# Patient Record
Sex: Female | Born: 1975 | Race: White | Hispanic: No | Marital: Married | Smoking: Never smoker
Health system: Southern US, Community
[De-identification: ages and names within clinical notes are randomized; demographics above are authoritative.]

## PROBLEM LIST (undated history)

## (undated) DIAGNOSIS — F32A Depression, unspecified: Secondary | ICD-10-CM

## (undated) DIAGNOSIS — I341 Nonrheumatic mitral (valve) prolapse: Secondary | ICD-10-CM

## (undated) DIAGNOSIS — F329 Major depressive disorder, single episode, unspecified: Secondary | ICD-10-CM

## (undated) HISTORY — PX: TONSILLECTOMY: SUR1361

---

## 2000-08-04 ENCOUNTER — Other Ambulatory Visit: Admission: RE | Admit: 2000-08-04 | Discharge: 2000-08-04 | Payer: Self-pay | Admitting: Gynecology

## 2001-02-11 ENCOUNTER — Inpatient Hospital Stay (HOSPITAL_COMMUNITY): Admission: AD | Admit: 2001-02-11 | Discharge: 2001-02-11 | Payer: Self-pay | Admitting: *Deleted

## 2001-02-22 ENCOUNTER — Inpatient Hospital Stay (HOSPITAL_COMMUNITY): Admission: AD | Admit: 2001-02-22 | Discharge: 2001-02-24 | Payer: Self-pay | Admitting: Gynecology

## 2001-02-25 ENCOUNTER — Encounter: Admission: RE | Admit: 2001-02-25 | Discharge: 2001-03-27 | Payer: Self-pay | Admitting: Gynecology

## 2001-04-08 ENCOUNTER — Other Ambulatory Visit: Admission: RE | Admit: 2001-04-08 | Discharge: 2001-04-08 | Payer: Self-pay | Admitting: *Deleted

## 2001-04-27 ENCOUNTER — Encounter: Admission: RE | Admit: 2001-04-27 | Discharge: 2001-05-27 | Payer: Self-pay | Admitting: Gynecology

## 2001-05-28 ENCOUNTER — Encounter: Admission: RE | Admit: 2001-05-28 | Discharge: 2001-06-27 | Payer: Self-pay | Admitting: Gynecology

## 2002-04-10 ENCOUNTER — Other Ambulatory Visit: Admission: RE | Admit: 2002-04-10 | Discharge: 2002-04-10 | Payer: Self-pay | Admitting: Gynecology

## 2003-04-16 ENCOUNTER — Other Ambulatory Visit: Admission: RE | Admit: 2003-04-16 | Discharge: 2003-04-16 | Payer: Self-pay | Admitting: Gynecology

## 2004-05-19 ENCOUNTER — Other Ambulatory Visit: Admission: RE | Admit: 2004-05-19 | Discharge: 2004-05-19 | Payer: Self-pay | Admitting: Gynecology

## 2005-07-28 ENCOUNTER — Other Ambulatory Visit: Admission: RE | Admit: 2005-07-28 | Discharge: 2005-07-28 | Payer: Self-pay | Admitting: Gynecology

## 2006-07-30 ENCOUNTER — Other Ambulatory Visit: Admission: RE | Admit: 2006-07-30 | Discharge: 2006-07-30 | Payer: Self-pay | Admitting: Gynecology

## 2007-07-25 ENCOUNTER — Inpatient Hospital Stay (HOSPITAL_COMMUNITY): Admission: AD | Admit: 2007-07-25 | Discharge: 2007-07-27 | Payer: Self-pay | Admitting: Obstetrics and Gynecology

## 2007-07-28 ENCOUNTER — Encounter: Admission: RE | Admit: 2007-07-28 | Discharge: 2007-08-27 | Payer: Self-pay | Admitting: Obstetrics and Gynecology

## 2007-08-28 ENCOUNTER — Encounter: Admission: RE | Admit: 2007-08-28 | Discharge: 2007-09-26 | Payer: Self-pay | Admitting: Obstetrics and Gynecology

## 2007-09-27 ENCOUNTER — Encounter: Admission: RE | Admit: 2007-09-27 | Discharge: 2007-10-27 | Payer: Self-pay | Admitting: Obstetrics and Gynecology

## 2007-10-28 ENCOUNTER — Encounter: Admission: RE | Admit: 2007-10-28 | Discharge: 2007-11-26 | Payer: Self-pay | Admitting: Obstetrics and Gynecology

## 2007-11-27 ENCOUNTER — Encounter: Admission: RE | Admit: 2007-11-27 | Discharge: 2007-12-27 | Payer: Self-pay | Admitting: Obstetrics and Gynecology

## 2007-12-28 ENCOUNTER — Encounter: Admission: RE | Admit: 2007-12-28 | Discharge: 2008-01-27 | Payer: Self-pay | Admitting: Obstetrics and Gynecology

## 2008-01-28 ENCOUNTER — Encounter: Admission: RE | Admit: 2008-01-28 | Discharge: 2008-02-24 | Payer: Self-pay | Admitting: Obstetrics and Gynecology

## 2008-02-25 ENCOUNTER — Encounter: Admission: RE | Admit: 2008-02-25 | Discharge: 2008-03-26 | Payer: Self-pay | Admitting: Obstetrics and Gynecology

## 2008-03-27 ENCOUNTER — Encounter: Admission: RE | Admit: 2008-03-27 | Discharge: 2008-04-25 | Payer: Self-pay | Admitting: Obstetrics and Gynecology

## 2008-04-26 ENCOUNTER — Encounter: Admission: RE | Admit: 2008-04-26 | Discharge: 2008-05-17 | Payer: Self-pay | Admitting: Obstetrics and Gynecology

## 2011-04-28 NOTE — Discharge Summary (Signed)
Kelly Bean, Kelly Bean                 ACCOUNT NO.:  [id number]   MEDICAL RECORD NO.:  [id number]          PATIENT TYPE:  INP   LOCATION:  9148                          FACILITY:  [facility]   PHYSICIAN:  Gerrit Friends. Aldona Bar, M.D.   DATE OF BIRTH:  1976/05/22   DATE OF ADMISSION:  07/25/2007  DATE OF DISCHARGE:  07/27/2007                               DISCHARGE SUMMARY   DISCHARGE DIAGNOSES:  1. 37-week intrauterine pregnancy, delivered 6 pounds 2 ounces female      infant, Apgars 05/19/08.  2. Blood type O positive.   PROCEDURES:  Normal spontaneous delivery.   SUMMARY:  This 35 year old gravida 3, para 2 was admitted at 37+ weeks  gestation after spontaneous rupture of membranes - clear fluid and onset  of contractions.  Her pregnancy was relatively uncomplicated - she does  take Prozac on a regular basis for depression.  She was admitted,  followed, progressed and subsequently had a normal spontaneous delivery  of viable female infant weighing 6 pounds 2 ounces over intact perineum.  Apgars were 05/19/08.  The patient's postpartum course was benign.  Discharge hemoglobin (August 12) was 11.0, white count of 12,000,  platelet count of 195,000.  The morning of August 13, she was ambulating  well, tolerating a regular diet well.  Her breast-feeding was going  well.  Vital signs were stable and she was desirous of discharge to  home.  Baby likewise was ready for discharge.  Appropriate instructions  were given to the patient per discharge brochure and understood by the  patient.   DISCHARGE MEDICATIONS:  1. vitamins - one a day as long she is breast-feeding.  2. Motrin 600 mg every 6 hours as needed for cramping or pain.  3. Tylox 1-2 every 4-6 hours as needed for more severe pain.   She will return the office follow-up in approximately four weeks' time  or as needed.   CONDITION ON DISCHARGE:  Improved.      Gerrit Friends. Aldona Bar, M.D.  Electronically Signed     RMW/MEDQ  D:  07/27/2007  T:   07/27/2007  Job:  [id number]

## 2011-04-28 NOTE — Discharge Summary (Signed)
NAMECRISTI, GWYNN                 ACCOUNT NO.:  [id number]   MEDICAL RECORD NO.:  [id number]          PATIENT TYPE:  INP   LOCATION:  9148                          FACILITY:  [facility]   PHYSICIAN:  Gerrit Friends. Aldona Bar, M.D.   DATE OF BIRTH:  [birth year]   DATE OF ADMISSION:  07/25/2007  DATE OF DISCHARGE:  07/27/2007                               DISCHARGE SUMMARY   DISCHARGE DIAGNOSES:  1. A 37-week intrauterine pregnancy, delivered a 6 pound 2 ounce female      infant, Apgars 6, 6, and 9.  2.   Dictation ended at this point.      Gerrit Friends. Aldona Bar, M.D.     RMW/MEDQ  D:  07/27/2007  T:  07/28/2007  Job:  [id number]

## 2011-09-28 LAB — CBC
HCT: 31.4 — ABNORMAL LOW
HCT: 37.5
Hemoglobin: 11 — ABNORMAL LOW
Hemoglobin: 12.8
MCHC: 34.1
MCHC: 35
MCV: 86.6
MCV: 86.8
Platelets: 195
Platelets: 235
RBC: 3.63 — ABNORMAL LOW
RBC: 4.32
RDW: 12.8
RDW: 13.4
WBC: 12 — ABNORMAL HIGH
WBC: 13.1 — ABNORMAL HIGH

## 2011-09-28 LAB — RPR: RPR Ser Ql: NONREACTIVE

## 2012-02-16 ENCOUNTER — Other Ambulatory Visit (HOSPITAL_BASED_OUTPATIENT_CLINIC_OR_DEPARTMENT_OTHER): Payer: Self-pay | Admitting: General Surgery

## 2012-02-16 ENCOUNTER — Ambulatory Visit (HOSPITAL_COMMUNITY)
Admission: RE | Admit: 2012-02-16 | Discharge: 2012-02-16 | Disposition: A | Discharge status: Home / Self Care | Payer: 59 | Source: Ambulatory Visit | Attending: General Surgery | Admitting: General Surgery

## 2012-02-16 DIAGNOSIS — T148XXA Other injury of unspecified body region, initial encounter: Secondary | ICD-10-CM

## 2012-02-16 DIAGNOSIS — M79609 Pain in unspecified limb: Secondary | ICD-10-CM | POA: Insufficient documentation

## 2012-02-16 DIAGNOSIS — M7730 Calcaneal spur, unspecified foot: Secondary | ICD-10-CM | POA: Insufficient documentation

## 2012-03-25 ENCOUNTER — Emergency Department (INDEPENDENT_AMBULATORY_CARE_PROVIDER_SITE_OTHER): Payer: 59

## 2012-03-25 ENCOUNTER — Encounter (HOSPITAL_BASED_OUTPATIENT_CLINIC_OR_DEPARTMENT_OTHER): Payer: Self-pay | Admitting: *Deleted

## 2012-03-25 ENCOUNTER — Emergency Department (HOSPITAL_BASED_OUTPATIENT_CLINIC_OR_DEPARTMENT_OTHER)
Admission: EM | Admit: 2012-03-25 | Discharge: 2012-03-25 | Disposition: A | Discharge status: Home / Self Care | Payer: 59 | Attending: Emergency Medicine | Admitting: Emergency Medicine

## 2012-03-25 DIAGNOSIS — R079 Chest pain, unspecified: Secondary | ICD-10-CM | POA: Insufficient documentation

## 2012-03-25 DIAGNOSIS — S20219A Contusion of unspecified front wall of thorax, initial encounter: Secondary | ICD-10-CM | POA: Insufficient documentation

## 2012-03-25 DIAGNOSIS — F329 Major depressive disorder, single episode, unspecified: Secondary | ICD-10-CM | POA: Insufficient documentation

## 2012-03-25 DIAGNOSIS — W010XXA Fall on same level from slipping, tripping and stumbling without subsequent striking against object, initial encounter: Secondary | ICD-10-CM | POA: Insufficient documentation

## 2012-03-25 DIAGNOSIS — Z79899 Other long term (current) drug therapy: Secondary | ICD-10-CM | POA: Insufficient documentation

## 2012-03-25 DIAGNOSIS — W19XXXA Unspecified fall, initial encounter: Secondary | ICD-10-CM

## 2012-03-25 DIAGNOSIS — R0602 Shortness of breath: Secondary | ICD-10-CM | POA: Insufficient documentation

## 2012-03-25 DIAGNOSIS — Y9366 Activity, soccer: Secondary | ICD-10-CM | POA: Insufficient documentation

## 2012-03-25 DIAGNOSIS — F3289 Other specified depressive episodes: Secondary | ICD-10-CM | POA: Insufficient documentation

## 2012-03-25 HISTORY — DX: Nonrheumatic mitral (valve) prolapse: I34.1

## 2012-03-25 HISTORY — DX: Depression, unspecified: F32.A

## 2012-03-25 HISTORY — DX: Major depressive disorder, single episode, unspecified: F32.9

## 2012-03-25 MED ORDER — IBUPROFEN 600 MG PO TABS: 600.0000 mg | ORAL_TABLET | Freq: Four times a day (QID) | ORAL | Status: AC | PRN

## 2012-03-25 MED ORDER — MORPHINE SULFATE 4 MG/ML IJ SOLN
4.0000 mg | Freq: Once | INTRAMUSCULAR | Status: AC
  Administered 2012-03-25: 4 mg via INTRAMUSCULAR
  Filled 2012-03-25: qty 1

## 2012-03-25 MED ORDER — HYDROCODONE-ACETAMINOPHEN 5-325 MG PO TABS: 1.0000 | ORAL_TABLET | Freq: Four times a day (QID) | ORAL | Status: AC | PRN

## 2012-03-25 NOTE — ED Notes (Signed)
Pt instructed on correct usage of IS, pt able to return demonstration correctly

## 2012-03-25 NOTE — ED Notes (Signed)
Tripped and fell tonight. Her left elbow hit her ribs. Sharp pain and sob as the night has progressed.

## 2012-03-25 NOTE — ED Notes (Signed)
Pain under left breast, stated that she felt a pop earlier today, pain radiates to back. Pain has gotten progressively worse

## 2012-03-25 NOTE — Discharge Instructions (Signed)
Rib Contusion  A rib contusion (bruise) can occur by a blow to the chest or by a fall against a hard object. Usually these will be much better in a couple weeks. If X-rays were taken today and there are no broken bones (fractures), the diagnosis of bruising is made. However, broken ribs may not show up for several days, or may be discovered later on a routine X-ray when signs of healing show up. If this happens to you, it does not mean that something was missed on the X-ray, but simply that it did not show up on the first X-rays. Earlier diagnosis will not usually change the treatment.  HOME CARE INSTRUCTIONS    Avoid strenuous activity. Be careful during activities and avoid bumping the injured ribs. Activities that pull on the injured ribs and cause pain should be avoided, if possible.   For the first day or two, an ice pack used every 20 minutes while awake may be helpful. Put ice in a plastic bag and put a towel between the bag and the skin.   Eat a normal, well-balanced diet. Drink plenty of fluids to avoid constipation.   Take deep breaths several times a day to keep lungs free of infection. Try to cough several times a day. Splint the injured area with a pillow while coughing to ease pain. Coughing can help prevent pneumonia.   Wear a rib belt or binder only if told to do so by your caregiver. If you are wearing a rib belt or binder, you must do the breathing exercises as directed by your caregiver. If not used properly, rib belts or binders restrict breathing which can lead to pneumonia.   Only take over-the-counter or prescription medicines for pain, discomfort, or fever as directed by your caregiver.  SEEK MEDICAL CARE IF:    You or your child has an oral temperature above 102 F (38.9 C).   Your baby is older than 3 months with a rectal temperature of 100.5 F (38.1 C) or higher for more than 1 day.   You develop a cough, with thick or bloody sputum.  SEEK IMMEDIATE MEDICAL CARE IF:    You  have difficulty breathing.   You feel sick to your stomach (nausea), have vomiting or belly (abdominal) pain.   You have worsening pain, not controlled with medications, or there is a change in the location of the pain.   You develop sweating or radiation of the pain into the arms, jaw or shoulders, or become light headed or faint.   You or your child has an oral temperature above 102 F (38.9 C), not controlled by medicine.   Your or your baby is older than 3 months with a rectal temperature of 102 F (38.9 C) or higher.   Your baby is 3 months old or younger with a rectal temperature of 100.4 F (38 C) or higher.  MAKE SURE YOU:    Understand these instructions.   Will watch your condition.   Will get help right away if you are not doing well or get worse.  Document Released: 08/25/2001 Document Revised: 11/19/2011 Document Reviewed: 07/18/2008  [employer] Patient Information 2012 [employer]

## 2012-03-25 NOTE — ED Provider Notes (Signed)
History     CSN: [id number]  Arrival date & time 03/25/12  2131   First MD Initiated Contact with Patient 03/25/12 2235      Chief Complaint  Patient presents with  . Fall    (Consider location/radiation/quality/duration/timing/severity/associated sxs/prior treatment) HPI  C/o pain left ribs. Larey Seat while playing soccer with daughter. Currently 10/10. No pain medication prior to arrival. Feels short of breath from pain intensity. Denies other injury. Denies N/V.  Denies arm pain. Denies neck pain, back pain.  Denies numbness/tingling/weakness of extr. No hip pain. Ambulatory without difficulty.  ED Notes, ED Provider Notes from 03/25/12 0000 to 03/25/12 21:54:12       Julieanne Manson, RN 03/25/2012 21:52      Tripped and fell tonight. Her left elbow hit her ribs. Sharp pain and sob as the night has progressed.      Past Medical History  Diagnosis Date  . Depression   . Mitral valve prolapse     Past Surgical History  Procedure Date  . Tonsillectomy     No family history on file.  History  Substance Use Topics  . Smoking status: Never Smoker   . Smokeless tobacco: Not on file  . Alcohol Use: No    OB History    Grav Para Term Preterm Abortions TAB SAB Ect Mult Living                  Review of Systems  All other systems reviewed and are negative.   except as noted HPI   Allergies  Review of patient's allergies indicates no known allergies.  Home Medications   Current Outpatient Rx  Name Route Sig Dispense Refill  . ACETAMINOPHEN 500 MG PO TABS Oral Take 1,000 mg by mouth every 6 (six) hours as needed. Patient used this medication for her headache.    . DESVENLAFAXINE SUCCINATE ER 50 MG PO TB24 Oral Take 50 mg by mouth daily. Patient took 25 mg of this tablet.  Patient stopped taking this medication three days ago.    Marland Kitchen DROSPIRENONE-ETHINYL ESTRADIOL 3-0.02 MG PO TABS Oral Take 1 tablet by mouth daily. Patient stopped taking this medication.    .  FLUOXETINE HCL 20 MG PO TABS Oral Take 20 mg by mouth daily.    Marland Kitchen HYDROCODONE-ACETAMINOPHEN 5-325 MG PO TABS Oral Take 1 tablet by mouth every 6 (six) hours as needed for pain. 6 tablet 0  . IBUPROFEN 600 MG PO TABS Oral Take 1 tablet (600 mg total) by mouth every 6 (six) hours as needed for pain. 30 tablet 0    BP 142/77  Pulse 80  Temp(Src) 98.6 F (37 C) (Oral)  Resp 20  SpO2 100%  LMP 03/04/2012  Physical Exam  Nursing note and vitals reviewed. Constitutional: She is oriented to person, place, and time. She appears well-developed.  HENT:  Head: Atraumatic.  Mouth/Throat: Oropharynx is clear and moist.  Eyes: Conjunctivae and EOM are normal. Pupils are equal, round, and reactive to light.  Neck: Normal range of motion. Neck supple.  Cardiovascular: Normal rate, regular rhythm, normal heart sounds and intact distal pulses.   Pulmonary/Chest: Effort normal and breath sounds normal. No respiratory distress. She has no wheezes. She has no rales. She exhibits tenderness.       Lt lower rib diffuse ttp  Abdominal: Soft. She exhibits no distension. There is no tenderness. There is no rebound and no guarding.  Musculoskeletal: Normal range of motion.  Neurological: She is alert  and oriented to person, place, and time.  Skin: Skin is warm and dry. No rash noted.  Psychiatric: She has a normal mood and affect.    ED Course  Procedures (including critical care time)  Labs Reviewed - No data to display Dg Ribs Unilateral W/chest Left  03/25/2012  *RADIOLOGY REPORT*  Clinical Data: Fall, left chest pain  LEFT RIBS AND CHEST - 3+ VIEW  Comparison: None.  Findings: Lungs are clear. No pleural effusion or pneumothorax.  Cardiomediastinal silhouette is within normal limits.  No left rib fracture is seen.  IMPRESSION: No evidence of acute cardiopulmonary disease.  No left rib fracture is seen.  Original Report Authenticated By: Charline Bills, M.D.     1. Rib contusion   2. Fall      MDM  Rib contusion. Pain control, IS, home with PMD f/u as needed. Precautions for return.        Forbes Cellar, MD 03/25/12 2337

## 2014-01-22 IMAGING — CR DG FOOT COMPLETE 3+V*R*
3 series · 3 of 3 positions shown · non-contrast
Comparison: None.

CLINICAL DATA: Pain

RIGHT FOOT COMPLETE - 3+ VIEW

[t foot ap right]
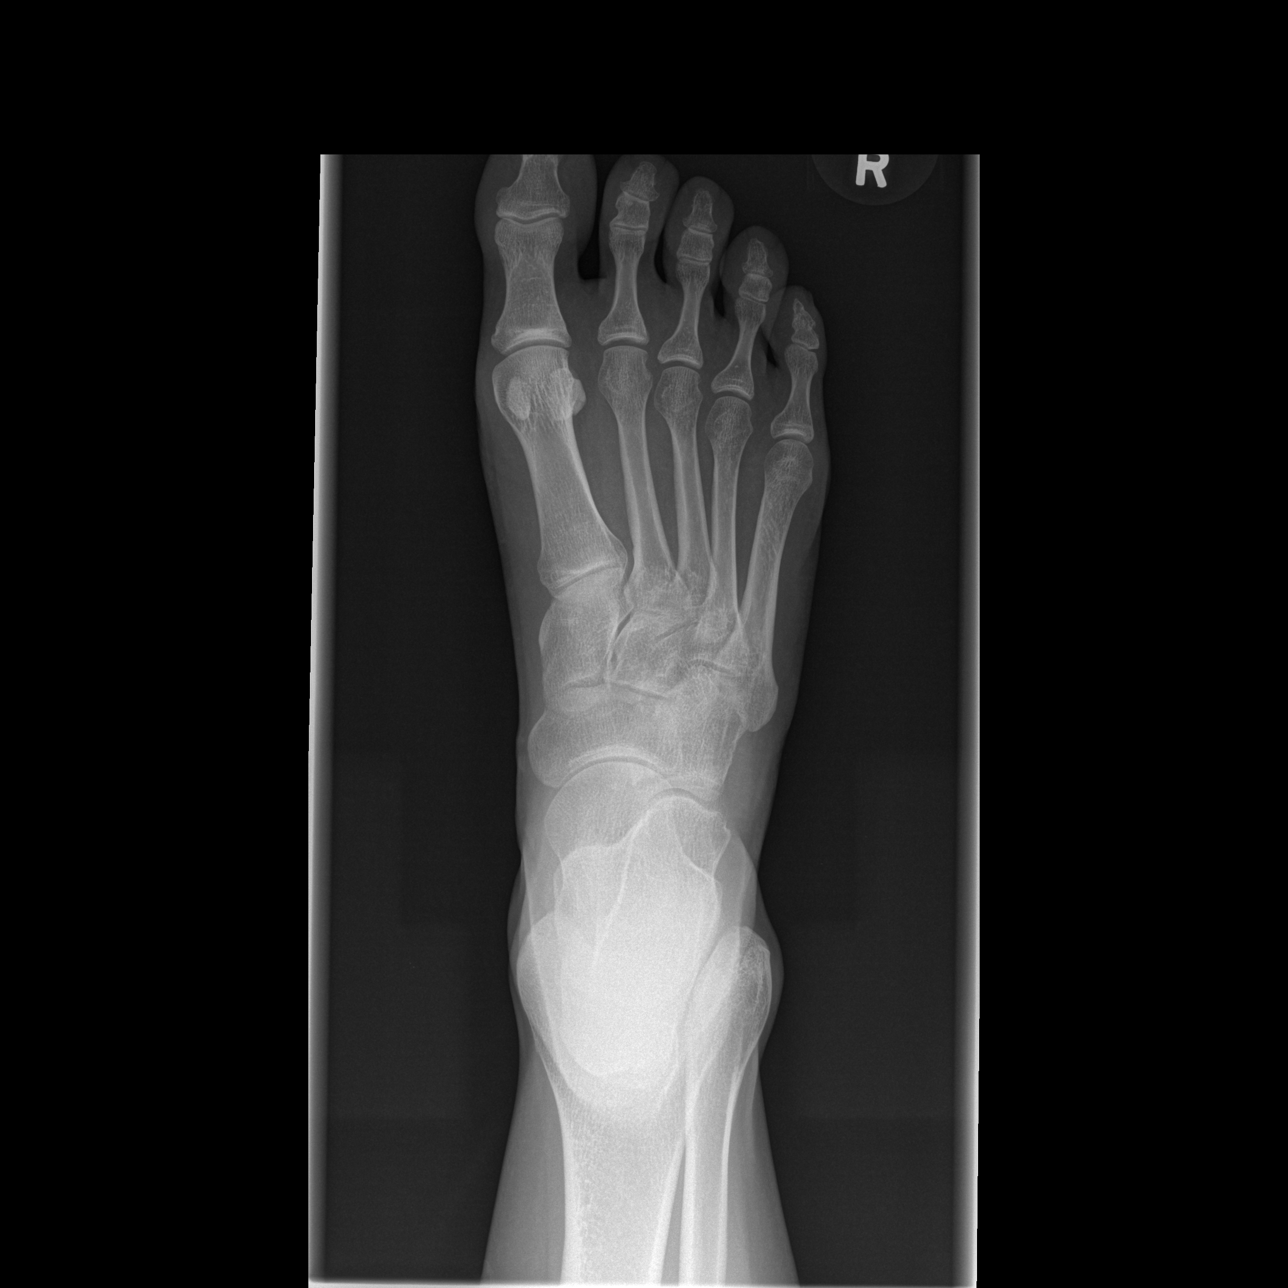

[t foot oblique right]
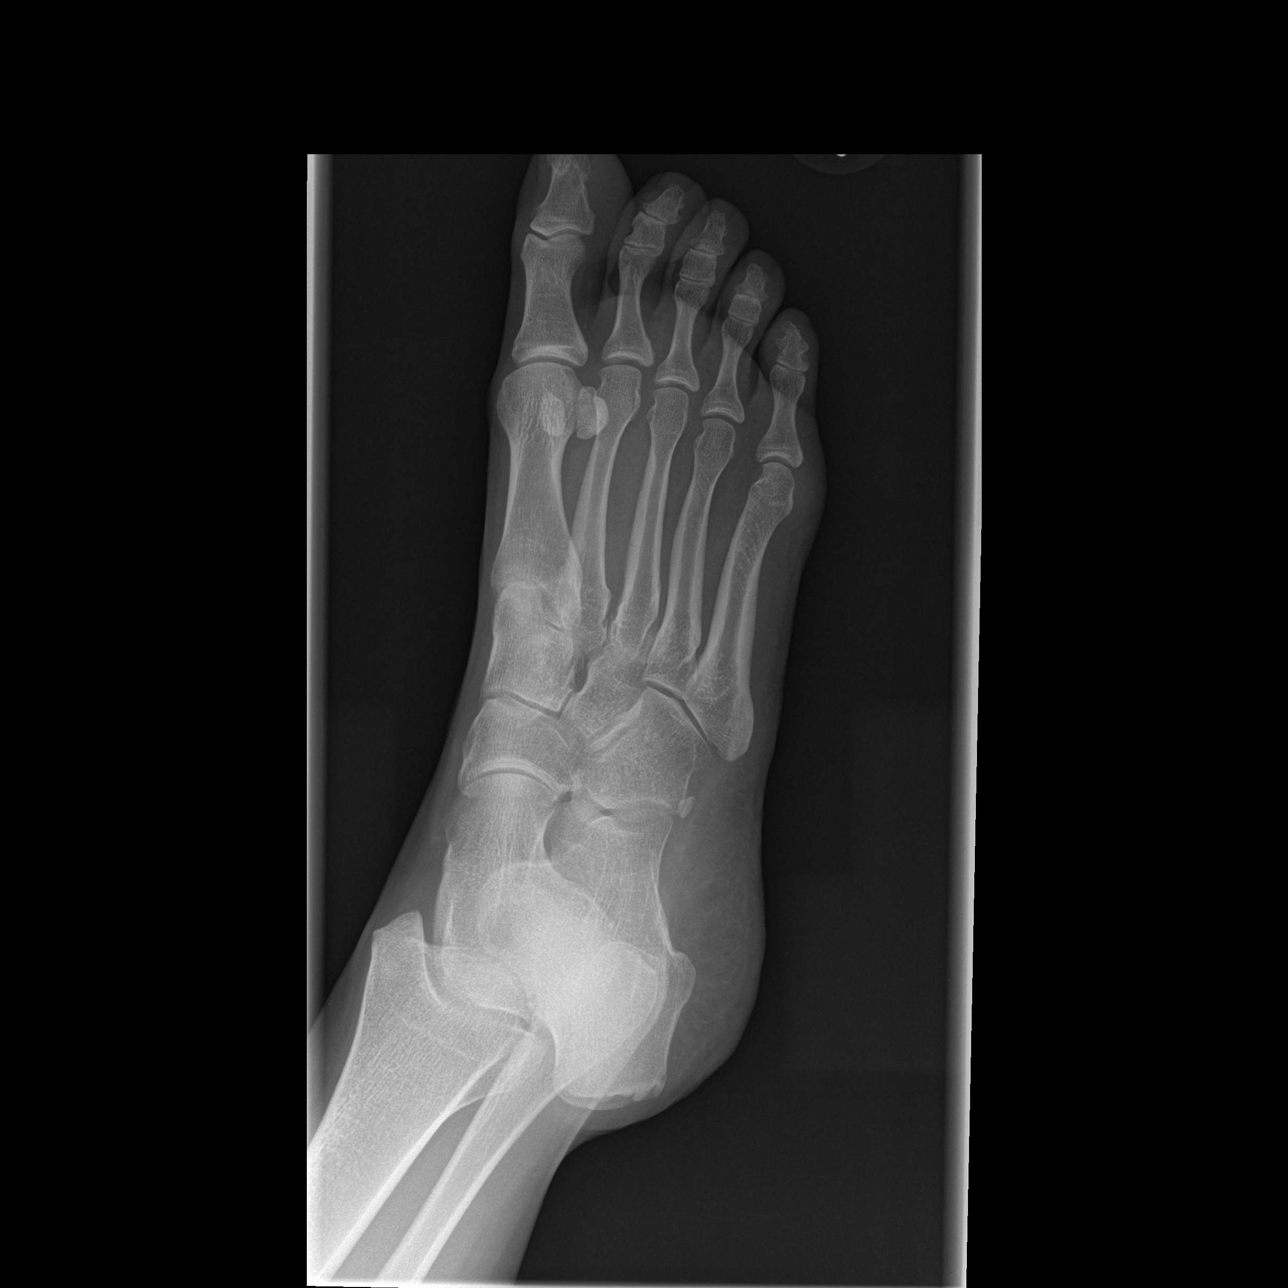

[t foot lat right]
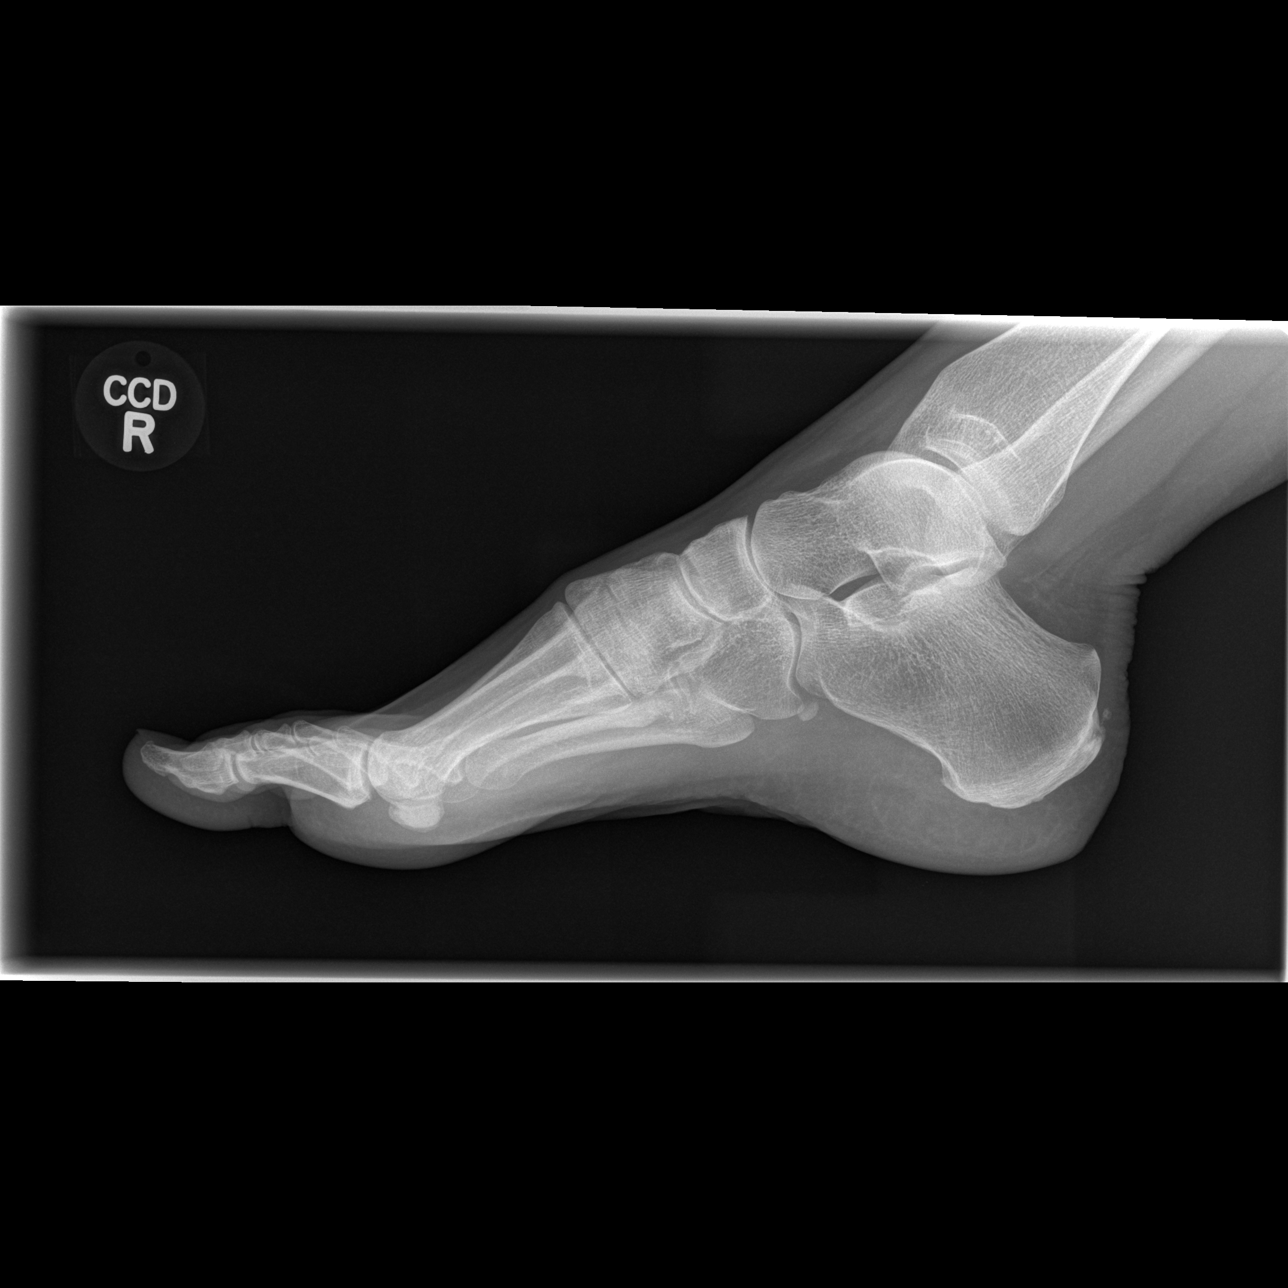

[3 of 3 positions shown; findings below may reference images not displayed]

FINDINGS: A small posterior calcaneal spur is noted.  Otherwise,
there is no evidence of bone, joint, or soft tissue abnormality.
IMPRESSION: Small posterior calcaneal spur.  No acute abnormality.

## 2014-04-18 ENCOUNTER — Other Ambulatory Visit: Payer: Self-pay | Admitting: Family

## 2014-04-18 DIAGNOSIS — N63 Unspecified lump in unspecified breast: Secondary | ICD-10-CM

## 2014-04-23 ENCOUNTER — Ambulatory Visit
Admission: RE | Admit: 2014-04-23 | Discharge: 2014-04-23 | Disposition: A | Discharge status: Home / Self Care | Payer: BC Managed Care – PPO | Source: Ambulatory Visit | Attending: Family | Admitting: Family

## 2014-04-23 DIAGNOSIS — N63 Unspecified lump in unspecified breast: Secondary | ICD-10-CM

## 2015-10-28 ENCOUNTER — Other Ambulatory Visit: Payer: Self-pay | Admitting: Obstetrics and Gynecology

## 2015-10-28 DIAGNOSIS — R928 Other abnormal and inconclusive findings on diagnostic imaging of breast: Secondary | ICD-10-CM

## 2015-11-01 ENCOUNTER — Ambulatory Visit
Admission: RE | Admit: 2015-11-01 | Discharge: 2015-11-01 | Disposition: A | Discharge status: Home / Self Care | Payer: BLUE CROSS/BLUE SHIELD | Source: Ambulatory Visit | Attending: Obstetrics and Gynecology | Admitting: Obstetrics and Gynecology

## 2015-11-01 DIAGNOSIS — R928 Other abnormal and inconclusive findings on diagnostic imaging of breast: Secondary | ICD-10-CM

## 2017-05-12 DIAGNOSIS — L814 Other melanin hyperpigmentation: Secondary | ICD-10-CM | POA: Diagnosis not present

## 2017-05-12 DIAGNOSIS — D225 Melanocytic nevi of trunk: Secondary | ICD-10-CM | POA: Diagnosis not present

## 2017-05-12 DIAGNOSIS — L821 Other seborrheic keratosis: Secondary | ICD-10-CM | POA: Diagnosis not present

## 2017-05-12 DIAGNOSIS — D1801 Hemangioma of skin and subcutaneous tissue: Secondary | ICD-10-CM | POA: Diagnosis not present

## 2017-12-23 DIAGNOSIS — Z135 Encounter for screening for eye and ear disorders: Secondary | ICD-10-CM | POA: Diagnosis not present

## 2017-12-23 DIAGNOSIS — H5213 Myopia, bilateral: Secondary | ICD-10-CM | POA: Diagnosis not present

## 2017-12-29 DIAGNOSIS — F419 Anxiety disorder, unspecified: Secondary | ICD-10-CM | POA: Diagnosis not present

## 2017-12-29 DIAGNOSIS — G4700 Insomnia, unspecified: Secondary | ICD-10-CM | POA: Diagnosis not present

## 2018-01-12 DIAGNOSIS — Z124 Encounter for screening for malignant neoplasm of cervix: Secondary | ICD-10-CM | POA: Diagnosis not present

## 2018-01-12 DIAGNOSIS — Z01419 Encounter for gynecological examination (general) (routine) without abnormal findings: Secondary | ICD-10-CM | POA: Diagnosis not present

## 2018-01-12 DIAGNOSIS — Z1231 Encounter for screening mammogram for malignant neoplasm of breast: Secondary | ICD-10-CM | POA: Diagnosis not present

## 2018-01-12 DIAGNOSIS — Z6821 Body mass index (BMI) 21.0-21.9, adult: Secondary | ICD-10-CM | POA: Diagnosis not present

## 2018-01-13 DIAGNOSIS — R6889 Other general symptoms and signs: Secondary | ICD-10-CM | POA: Diagnosis not present

## 2018-01-19 DIAGNOSIS — F458 Other somatoform disorders: Secondary | ICD-10-CM | POA: Diagnosis not present

## 2018-01-26 DIAGNOSIS — F419 Anxiety disorder, unspecified: Secondary | ICD-10-CM | POA: Diagnosis not present

## 2018-04-25 DIAGNOSIS — M5412 Radiculopathy, cervical region: Secondary | ICD-10-CM | POA: Diagnosis not present

## 2018-04-25 DIAGNOSIS — F419 Anxiety disorder, unspecified: Secondary | ICD-10-CM | POA: Diagnosis not present

## 2018-04-25 DIAGNOSIS — Z8659 Personal history of other mental and behavioral disorders: Secondary | ICD-10-CM | POA: Diagnosis not present

## 2018-05-03 DIAGNOSIS — M5412 Radiculopathy, cervical region: Secondary | ICD-10-CM | POA: Diagnosis not present

## 2018-05-06 DIAGNOSIS — M5412 Radiculopathy, cervical region: Secondary | ICD-10-CM | POA: Diagnosis not present

## 2018-05-11 DIAGNOSIS — M5412 Radiculopathy, cervical region: Secondary | ICD-10-CM | POA: Diagnosis not present

## 2018-05-16 DIAGNOSIS — M5412 Radiculopathy, cervical region: Secondary | ICD-10-CM | POA: Diagnosis not present

## 2018-05-21 DIAGNOSIS — M5412 Radiculopathy, cervical region: Secondary | ICD-10-CM | POA: Diagnosis not present

## 2018-05-23 DIAGNOSIS — M5412 Radiculopathy, cervical region: Secondary | ICD-10-CM | POA: Diagnosis not present

## 2018-05-26 DIAGNOSIS — M5412 Radiculopathy, cervical region: Secondary | ICD-10-CM | POA: Diagnosis not present

## 2018-05-31 DIAGNOSIS — M5412 Radiculopathy, cervical region: Secondary | ICD-10-CM | POA: Diagnosis not present

## 2018-06-03 DIAGNOSIS — M5412 Radiculopathy, cervical region: Secondary | ICD-10-CM | POA: Diagnosis not present

## 2018-06-06 DIAGNOSIS — M5412 Radiculopathy, cervical region: Secondary | ICD-10-CM | POA: Diagnosis not present

## 2018-06-08 DIAGNOSIS — M5412 Radiculopathy, cervical region: Secondary | ICD-10-CM | POA: Diagnosis not present

## 2018-06-28 DIAGNOSIS — M5412 Radiculopathy, cervical region: Secondary | ICD-10-CM | POA: Diagnosis not present

## 2018-11-07 DIAGNOSIS — F419 Anxiety disorder, unspecified: Secondary | ICD-10-CM | POA: Diagnosis not present

## 2018-11-07 DIAGNOSIS — Z23 Encounter for immunization: Secondary | ICD-10-CM | POA: Diagnosis not present

## 2019-01-23 DIAGNOSIS — Z01419 Encounter for gynecological examination (general) (routine) without abnormal findings: Secondary | ICD-10-CM | POA: Diagnosis not present

## 2019-01-23 DIAGNOSIS — Z1231 Encounter for screening mammogram for malignant neoplasm of breast: Secondary | ICD-10-CM | POA: Diagnosis not present

## 2019-01-23 DIAGNOSIS — Z6821 Body mass index (BMI) 21.0-21.9, adult: Secondary | ICD-10-CM | POA: Diagnosis not present

## 2019-01-23 DIAGNOSIS — Z124 Encounter for screening for malignant neoplasm of cervix: Secondary | ICD-10-CM | POA: Diagnosis not present

## 2019-02-03 DIAGNOSIS — D225 Melanocytic nevi of trunk: Secondary | ICD-10-CM | POA: Diagnosis not present

## 2019-02-03 DIAGNOSIS — C44519 Basal cell carcinoma of skin of other part of trunk: Secondary | ICD-10-CM | POA: Diagnosis not present

## 2019-02-03 DIAGNOSIS — B958 Unspecified staphylococcus as the cause of diseases classified elsewhere: Secondary | ICD-10-CM | POA: Diagnosis not present

## 2019-02-03 DIAGNOSIS — L02214 Cutaneous abscess of groin: Secondary | ICD-10-CM | POA: Diagnosis not present

## 2019-02-03 DIAGNOSIS — L0889 Other specified local infections of the skin and subcutaneous tissue: Secondary | ICD-10-CM | POA: Diagnosis not present

## 2019-02-03 DIAGNOSIS — C44319 Basal cell carcinoma of skin of other parts of face: Secondary | ICD-10-CM | POA: Diagnosis not present

## 2019-02-03 DIAGNOSIS — L821 Other seborrheic keratosis: Secondary | ICD-10-CM | POA: Diagnosis not present

## 2019-02-17 DIAGNOSIS — C44519 Basal cell carcinoma of skin of other part of trunk: Secondary | ICD-10-CM | POA: Diagnosis not present

## 2019-03-07 DIAGNOSIS — C44319 Basal cell carcinoma of skin of other parts of face: Secondary | ICD-10-CM | POA: Diagnosis not present

## 2019-06-06 DIAGNOSIS — F419 Anxiety disorder, unspecified: Secondary | ICD-10-CM | POA: Diagnosis not present

## 2019-06-06 DIAGNOSIS — G4700 Insomnia, unspecified: Secondary | ICD-10-CM | POA: Diagnosis not present

## 2019-07-12 DIAGNOSIS — L720 Epidermal cyst: Secondary | ICD-10-CM | POA: Diagnosis not present

## 2019-07-12 DIAGNOSIS — L910 Hypertrophic scar: Secondary | ICD-10-CM | POA: Diagnosis not present

## 2019-07-12 DIAGNOSIS — Z85828 Personal history of other malignant neoplasm of skin: Secondary | ICD-10-CM | POA: Diagnosis not present

## 2019-09-05 DIAGNOSIS — F411 Generalized anxiety disorder: Secondary | ICD-10-CM | POA: Diagnosis not present

## 2019-11-13 DIAGNOSIS — R202 Paresthesia of skin: Secondary | ICD-10-CM | POA: Diagnosis not present

## 2019-11-13 DIAGNOSIS — Z03818 Encounter for observation for suspected exposure to other biological agents ruled out: Secondary | ICD-10-CM | POA: Diagnosis not present

## 2019-11-17 DIAGNOSIS — M542 Cervicalgia: Secondary | ICD-10-CM | POA: Diagnosis not present

## 2019-11-28 DIAGNOSIS — Z131 Encounter for screening for diabetes mellitus: Secondary | ICD-10-CM | POA: Diagnosis not present

## 2019-11-28 DIAGNOSIS — Z23 Encounter for immunization: Secondary | ICD-10-CM | POA: Diagnosis not present

## 2019-11-28 DIAGNOSIS — G4700 Insomnia, unspecified: Secondary | ICD-10-CM | POA: Diagnosis not present

## 2019-11-28 DIAGNOSIS — Z13220 Encounter for screening for lipoid disorders: Secondary | ICD-10-CM | POA: Diagnosis not present

## 2019-11-28 DIAGNOSIS — M542 Cervicalgia: Secondary | ICD-10-CM | POA: Diagnosis not present

## 2019-11-28 DIAGNOSIS — F419 Anxiety disorder, unspecified: Secondary | ICD-10-CM | POA: Diagnosis not present

## 2019-11-28 DIAGNOSIS — Z0000 Encounter for general adult medical examination without abnormal findings: Secondary | ICD-10-CM | POA: Diagnosis not present

## 2019-11-30 ENCOUNTER — Other Ambulatory Visit (HOSPITAL_COMMUNITY): Payer: Self-pay | Admitting: Physician Assistant

## 2019-11-30 ENCOUNTER — Encounter (HOSPITAL_COMMUNITY): Payer: Self-pay | Admitting: Physician Assistant

## 2019-11-30 DIAGNOSIS — I341 Nonrheumatic mitral (valve) prolapse: Secondary | ICD-10-CM

## 2019-12-13 ENCOUNTER — Ambulatory Visit (HOSPITAL_COMMUNITY): Payer: BC Managed Care – PPO | Attending: Physician Assistant

## 2019-12-13 ENCOUNTER — Other Ambulatory Visit: Payer: Self-pay

## 2019-12-13 DIAGNOSIS — I341 Nonrheumatic mitral (valve) prolapse: Secondary | ICD-10-CM

## 2020-01-03 DIAGNOSIS — L814 Other melanin hyperpigmentation: Secondary | ICD-10-CM | POA: Diagnosis not present

## 2020-01-03 DIAGNOSIS — D225 Melanocytic nevi of trunk: Secondary | ICD-10-CM | POA: Diagnosis not present

## 2020-01-03 DIAGNOSIS — Z85828 Personal history of other malignant neoplasm of skin: Secondary | ICD-10-CM | POA: Diagnosis not present

## 2020-01-03 DIAGNOSIS — D1801 Hemangioma of skin and subcutaneous tissue: Secondary | ICD-10-CM | POA: Diagnosis not present

## 2020-02-19 DIAGNOSIS — Z6821 Body mass index (BMI) 21.0-21.9, adult: Secondary | ICD-10-CM | POA: Diagnosis not present

## 2020-02-19 DIAGNOSIS — Z1231 Encounter for screening mammogram for malignant neoplasm of breast: Secondary | ICD-10-CM | POA: Diagnosis not present

## 2020-02-19 DIAGNOSIS — Z01419 Encounter for gynecological examination (general) (routine) without abnormal findings: Secondary | ICD-10-CM | POA: Diagnosis not present

## 2020-10-23 DIAGNOSIS — L858 Other specified epidermal thickening: Secondary | ICD-10-CM | POA: Diagnosis not present

## 2020-10-23 DIAGNOSIS — L814 Other melanin hyperpigmentation: Secondary | ICD-10-CM | POA: Diagnosis not present

## 2020-10-23 DIAGNOSIS — Z85828 Personal history of other malignant neoplasm of skin: Secondary | ICD-10-CM | POA: Diagnosis not present

## 2020-10-23 DIAGNOSIS — L821 Other seborrheic keratosis: Secondary | ICD-10-CM | POA: Diagnosis not present

## 2020-10-23 DIAGNOSIS — L859 Epidermal thickening, unspecified: Secondary | ICD-10-CM | POA: Diagnosis not present

## 2020-12-10 DIAGNOSIS — G4700 Insomnia, unspecified: Secondary | ICD-10-CM | POA: Diagnosis not present

## 2021-01-02 DIAGNOSIS — L814 Other melanin hyperpigmentation: Secondary | ICD-10-CM | POA: Diagnosis not present

## 2021-01-02 DIAGNOSIS — L570 Actinic keratosis: Secondary | ICD-10-CM | POA: Diagnosis not present

## 2021-01-02 DIAGNOSIS — Z85828 Personal history of other malignant neoplasm of skin: Secondary | ICD-10-CM | POA: Diagnosis not present

## 2021-01-02 DIAGNOSIS — D225 Melanocytic nevi of trunk: Secondary | ICD-10-CM | POA: Diagnosis not present

## 2021-01-02 DIAGNOSIS — L820 Inflamed seborrheic keratosis: Secondary | ICD-10-CM | POA: Diagnosis not present

## 2021-01-02 DIAGNOSIS — L910 Hypertrophic scar: Secondary | ICD-10-CM | POA: Diagnosis not present

## 2021-01-02 DIAGNOSIS — L821 Other seborrheic keratosis: Secondary | ICD-10-CM | POA: Diagnosis not present

## 2021-01-10 DIAGNOSIS — Z131 Encounter for screening for diabetes mellitus: Secondary | ICD-10-CM | POA: Diagnosis not present

## 2021-01-10 DIAGNOSIS — Z0189 Encounter for other specified special examinations: Secondary | ICD-10-CM | POA: Diagnosis not present

## 2021-01-10 DIAGNOSIS — R5383 Other fatigue: Secondary | ICD-10-CM | POA: Diagnosis not present

## 2021-01-10 DIAGNOSIS — Z13220 Encounter for screening for lipoid disorders: Secondary | ICD-10-CM | POA: Diagnosis not present

## 2021-01-13 DIAGNOSIS — M549 Dorsalgia, unspecified: Secondary | ICD-10-CM | POA: Diagnosis not present

## 2021-02-21 DIAGNOSIS — Z01419 Encounter for gynecological examination (general) (routine) without abnormal findings: Secondary | ICD-10-CM | POA: Diagnosis not present

## 2021-02-21 DIAGNOSIS — Z1231 Encounter for screening mammogram for malignant neoplasm of breast: Secondary | ICD-10-CM | POA: Diagnosis not present

## 2021-03-05 DIAGNOSIS — D485 Neoplasm of uncertain behavior of skin: Secondary | ICD-10-CM | POA: Diagnosis not present

## 2021-03-05 DIAGNOSIS — L820 Inflamed seborrheic keratosis: Secondary | ICD-10-CM | POA: Diagnosis not present

## 2022-01-06 DIAGNOSIS — L821 Other seborrheic keratosis: Secondary | ICD-10-CM | POA: Diagnosis not present

## 2022-01-06 DIAGNOSIS — L570 Actinic keratosis: Secondary | ICD-10-CM | POA: Diagnosis not present

## 2022-01-06 DIAGNOSIS — L905 Scar conditions and fibrosis of skin: Secondary | ICD-10-CM | POA: Diagnosis not present

## 2022-01-06 DIAGNOSIS — L308 Other specified dermatitis: Secondary | ICD-10-CM | POA: Diagnosis not present

## 2022-01-06 DIAGNOSIS — Z85828 Personal history of other malignant neoplasm of skin: Secondary | ICD-10-CM | POA: Diagnosis not present

## 2022-01-30 DIAGNOSIS — Z131 Encounter for screening for diabetes mellitus: Secondary | ICD-10-CM | POA: Diagnosis not present

## 2022-01-30 DIAGNOSIS — Z0000 Encounter for general adult medical examination without abnormal findings: Secondary | ICD-10-CM | POA: Diagnosis not present

## 2022-01-30 DIAGNOSIS — G4700 Insomnia, unspecified: Secondary | ICD-10-CM | POA: Diagnosis not present

## 2022-03-11 DIAGNOSIS — F411 Generalized anxiety disorder: Secondary | ICD-10-CM | POA: Diagnosis not present

## 2022-04-09 DIAGNOSIS — F411 Generalized anxiety disorder: Secondary | ICD-10-CM | POA: Diagnosis not present

## 2022-04-23 DIAGNOSIS — Z1231 Encounter for screening mammogram for malignant neoplasm of breast: Secondary | ICD-10-CM | POA: Diagnosis not present

## 2022-04-23 DIAGNOSIS — Z01419 Encounter for gynecological examination (general) (routine) without abnormal findings: Secondary | ICD-10-CM | POA: Diagnosis not present

## 2022-04-27 DIAGNOSIS — R635 Abnormal weight gain: Secondary | ICD-10-CM | POA: Diagnosis not present

## 2022-04-27 DIAGNOSIS — G4700 Insomnia, unspecified: Secondary | ICD-10-CM | POA: Diagnosis not present

## 2022-04-27 DIAGNOSIS — Z1211 Encounter for screening for malignant neoplasm of colon: Secondary | ICD-10-CM | POA: Diagnosis not present

## 2022-05-20 DIAGNOSIS — Z1211 Encounter for screening for malignant neoplasm of colon: Secondary | ICD-10-CM | POA: Diagnosis not present

## 2022-06-30 DIAGNOSIS — F411 Generalized anxiety disorder: Secondary | ICD-10-CM | POA: Diagnosis not present

## 2022-07-16 DIAGNOSIS — R195 Other fecal abnormalities: Secondary | ICD-10-CM | POA: Diagnosis not present

## 2022-09-21 DIAGNOSIS — R195 Other fecal abnormalities: Secondary | ICD-10-CM | POA: Diagnosis not present

## 2022-09-21 DIAGNOSIS — K648 Other hemorrhoids: Secondary | ICD-10-CM | POA: Diagnosis not present

## 2022-09-21 DIAGNOSIS — K635 Polyp of colon: Secondary | ICD-10-CM | POA: Diagnosis not present

## 2022-11-13 DIAGNOSIS — M542 Cervicalgia: Secondary | ICD-10-CM | POA: Diagnosis not present

## 2022-11-13 DIAGNOSIS — S161XXA Strain of muscle, fascia and tendon at neck level, initial encounter: Secondary | ICD-10-CM | POA: Diagnosis not present

## 2022-11-25 DIAGNOSIS — M542 Cervicalgia: Secondary | ICD-10-CM | POA: Diagnosis not present

## 2023-01-15 DIAGNOSIS — F411 Generalized anxiety disorder: Secondary | ICD-10-CM | POA: Diagnosis not present

## 2023-01-22 DIAGNOSIS — N959 Unspecified menopausal and perimenopausal disorder: Secondary | ICD-10-CM | POA: Diagnosis not present

## 2023-02-03 DIAGNOSIS — L821 Other seborrheic keratosis: Secondary | ICD-10-CM | POA: Diagnosis not present

## 2023-02-03 DIAGNOSIS — D2272 Melanocytic nevi of left lower limb, including hip: Secondary | ICD-10-CM | POA: Diagnosis not present

## 2023-02-03 DIAGNOSIS — L820 Inflamed seborrheic keratosis: Secondary | ICD-10-CM | POA: Diagnosis not present

## 2023-02-03 DIAGNOSIS — D225 Melanocytic nevi of trunk: Secondary | ICD-10-CM | POA: Diagnosis not present

## 2023-02-03 DIAGNOSIS — Z85828 Personal history of other malignant neoplasm of skin: Secondary | ICD-10-CM | POA: Diagnosis not present

## 2023-02-05 DIAGNOSIS — M79671 Pain in right foot: Secondary | ICD-10-CM | POA: Diagnosis not present

## 2023-02-24 DIAGNOSIS — M79671 Pain in right foot: Secondary | ICD-10-CM | POA: Diagnosis not present

## 2023-03-23 DIAGNOSIS — Z713 Dietary counseling and surveillance: Secondary | ICD-10-CM | POA: Diagnosis not present

## 2023-03-23 DIAGNOSIS — Z6823 Body mass index (BMI) 23.0-23.9, adult: Secondary | ICD-10-CM | POA: Diagnosis not present

## 2023-03-24 DIAGNOSIS — M79671 Pain in right foot: Secondary | ICD-10-CM | POA: Diagnosis not present

## 2023-04-07 DIAGNOSIS — Z13220 Encounter for screening for lipoid disorders: Secondary | ICD-10-CM | POA: Diagnosis not present

## 2023-04-07 DIAGNOSIS — Z0000 Encounter for general adult medical examination without abnormal findings: Secondary | ICD-10-CM | POA: Diagnosis not present

## 2023-04-13 DIAGNOSIS — Z6822 Body mass index (BMI) 22.0-22.9, adult: Secondary | ICD-10-CM | POA: Diagnosis not present

## 2023-04-13 DIAGNOSIS — Z1231 Encounter for screening mammogram for malignant neoplasm of breast: Secondary | ICD-10-CM | POA: Diagnosis not present

## 2023-04-13 DIAGNOSIS — R5383 Other fatigue: Secondary | ICD-10-CM | POA: Diagnosis not present

## 2023-04-13 DIAGNOSIS — Z01419 Encounter for gynecological examination (general) (routine) without abnormal findings: Secondary | ICD-10-CM | POA: Diagnosis not present

## 2023-04-27 DIAGNOSIS — Z13820 Encounter for screening for osteoporosis: Secondary | ICD-10-CM | POA: Diagnosis not present

## 2023-05-11 DIAGNOSIS — Z6822 Body mass index (BMI) 22.0-22.9, adult: Secondary | ICD-10-CM | POA: Diagnosis not present

## 2023-05-11 DIAGNOSIS — Z713 Dietary counseling and surveillance: Secondary | ICD-10-CM | POA: Diagnosis not present

## 2023-07-07 DIAGNOSIS — Z713 Dietary counseling and surveillance: Secondary | ICD-10-CM | POA: Diagnosis not present

## 2023-07-07 DIAGNOSIS — Z6822 Body mass index (BMI) 22.0-22.9, adult: Secondary | ICD-10-CM | POA: Diagnosis not present

## 2023-09-29 DIAGNOSIS — Z713 Dietary counseling and surveillance: Secondary | ICD-10-CM | POA: Diagnosis not present

## 2023-09-29 DIAGNOSIS — Z6822 Body mass index (BMI) 22.0-22.9, adult: Secondary | ICD-10-CM | POA: Diagnosis not present

## 2023-11-30 DIAGNOSIS — M545 Low back pain, unspecified: Secondary | ICD-10-CM | POA: Diagnosis not present

## 2024-01-06 DIAGNOSIS — Z6822 Body mass index (BMI) 22.0-22.9, adult: Secondary | ICD-10-CM | POA: Diagnosis not present

## 2024-01-06 DIAGNOSIS — Z713 Dietary counseling and surveillance: Secondary | ICD-10-CM | POA: Diagnosis not present

## 2024-02-14 DIAGNOSIS — F411 Generalized anxiety disorder: Secondary | ICD-10-CM | POA: Diagnosis not present

## 2024-02-14 DIAGNOSIS — F331 Major depressive disorder, recurrent, moderate: Secondary | ICD-10-CM | POA: Diagnosis not present

## 2024-03-08 DIAGNOSIS — B354 Tinea corporis: Secondary | ICD-10-CM | POA: Diagnosis not present

## 2024-03-08 DIAGNOSIS — D2272 Melanocytic nevi of left lower limb, including hip: Secondary | ICD-10-CM | POA: Diagnosis not present

## 2024-03-08 DIAGNOSIS — L814 Other melanin hyperpigmentation: Secondary | ICD-10-CM | POA: Diagnosis not present

## 2024-03-08 DIAGNOSIS — L820 Inflamed seborrheic keratosis: Secondary | ICD-10-CM | POA: Diagnosis not present

## 2024-03-08 DIAGNOSIS — L905 Scar conditions and fibrosis of skin: Secondary | ICD-10-CM | POA: Diagnosis not present

## 2024-03-08 DIAGNOSIS — Z85828 Personal history of other malignant neoplasm of skin: Secondary | ICD-10-CM | POA: Diagnosis not present

## 2024-04-05 DIAGNOSIS — E669 Obesity, unspecified: Secondary | ICD-10-CM | POA: Diagnosis not present

## 2024-04-05 DIAGNOSIS — N951 Menopausal and female climacteric states: Secondary | ICD-10-CM | POA: Diagnosis not present

## 2024-04-05 DIAGNOSIS — Z6822 Body mass index (BMI) 22.0-22.9, adult: Secondary | ICD-10-CM | POA: Diagnosis not present

## 2024-04-17 DIAGNOSIS — F411 Generalized anxiety disorder: Secondary | ICD-10-CM | POA: Diagnosis not present

## 2024-04-17 DIAGNOSIS — F331 Major depressive disorder, recurrent, moderate: Secondary | ICD-10-CM | POA: Diagnosis not present

## 2024-04-20 DIAGNOSIS — Z0000 Encounter for general adult medical examination without abnormal findings: Secondary | ICD-10-CM | POA: Diagnosis not present

## 2024-04-20 DIAGNOSIS — R5383 Other fatigue: Secondary | ICD-10-CM | POA: Diagnosis not present

## 2024-05-19 DIAGNOSIS — Z0000 Encounter for general adult medical examination without abnormal findings: Secondary | ICD-10-CM | POA: Diagnosis not present

## 2024-05-19 DIAGNOSIS — Z131 Encounter for screening for diabetes mellitus: Secondary | ICD-10-CM | POA: Diagnosis not present

## 2024-05-19 DIAGNOSIS — Z13220 Encounter for screening for lipoid disorders: Secondary | ICD-10-CM | POA: Diagnosis not present

## 2024-05-29 DIAGNOSIS — Z124 Encounter for screening for malignant neoplasm of cervix: Secondary | ICD-10-CM | POA: Diagnosis not present

## 2024-05-29 DIAGNOSIS — Z01419 Encounter for gynecological examination (general) (routine) without abnormal findings: Secondary | ICD-10-CM | POA: Diagnosis not present

## 2024-05-29 DIAGNOSIS — Z6822 Body mass index (BMI) 22.0-22.9, adult: Secondary | ICD-10-CM | POA: Diagnosis not present

## 2024-05-29 DIAGNOSIS — Z1231 Encounter for screening mammogram for malignant neoplasm of breast: Secondary | ICD-10-CM | POA: Diagnosis not present

## 2024-07-05 DIAGNOSIS — M67911 Unspecified disorder of synovium and tendon, right shoulder: Secondary | ICD-10-CM | POA: Diagnosis not present

## 2024-07-17 DIAGNOSIS — F4323 Adjustment disorder with mixed anxiety and depressed mood: Secondary | ICD-10-CM | POA: Diagnosis not present

## 2024-08-03 DIAGNOSIS — F4323 Adjustment disorder with mixed anxiety and depressed mood: Secondary | ICD-10-CM | POA: Diagnosis not present

## 2024-08-10 DIAGNOSIS — F4323 Adjustment disorder with mixed anxiety and depressed mood: Secondary | ICD-10-CM | POA: Diagnosis not present

## 2024-08-17 DIAGNOSIS — F4323 Adjustment disorder with mixed anxiety and depressed mood: Secondary | ICD-10-CM | POA: Diagnosis not present

## 2024-08-22 DIAGNOSIS — F4323 Adjustment disorder with mixed anxiety and depressed mood: Secondary | ICD-10-CM | POA: Diagnosis not present

## 2024-08-31 DIAGNOSIS — F4323 Adjustment disorder with mixed anxiety and depressed mood: Secondary | ICD-10-CM | POA: Diagnosis not present

## 2024-09-07 DIAGNOSIS — F4323 Adjustment disorder with mixed anxiety and depressed mood: Secondary | ICD-10-CM | POA: Diagnosis not present

## 2024-09-14 DIAGNOSIS — F4323 Adjustment disorder with mixed anxiety and depressed mood: Secondary | ICD-10-CM | POA: Diagnosis not present

## 2024-09-15 DIAGNOSIS — L0390 Cellulitis, unspecified: Secondary | ICD-10-CM | POA: Diagnosis not present

## 2024-09-15 DIAGNOSIS — T300 Burn of unspecified body region, unspecified degree: Secondary | ICD-10-CM | POA: Diagnosis not present

## 2024-09-15 DIAGNOSIS — Z6822 Body mass index (BMI) 22.0-22.9, adult: Secondary | ICD-10-CM | POA: Diagnosis not present

## 2024-09-15 DIAGNOSIS — G4700 Insomnia, unspecified: Secondary | ICD-10-CM | POA: Diagnosis not present

## 2024-09-21 DIAGNOSIS — F4323 Adjustment disorder with mixed anxiety and depressed mood: Secondary | ICD-10-CM | POA: Diagnosis not present

## 2024-09-28 DIAGNOSIS — F4323 Adjustment disorder with mixed anxiety and depressed mood: Secondary | ICD-10-CM | POA: Diagnosis not present

## 2024-10-05 DIAGNOSIS — F4323 Adjustment disorder with mixed anxiety and depressed mood: Secondary | ICD-10-CM | POA: Diagnosis not present
